# Patient Record
Sex: Female | Born: 1980 | Race: White | Hispanic: No | Smoking: Never smoker
Health system: Southern US, Community
[De-identification: ages and names within clinical notes are randomized; demographics above are authoritative.]

## PROBLEM LIST (undated history)

## (undated) DIAGNOSIS — E039 Hypothyroidism, unspecified: Secondary | ICD-10-CM

## (undated) DIAGNOSIS — Z803 Family history of malignant neoplasm of breast: Secondary | ICD-10-CM

## (undated) DIAGNOSIS — F32A Depression, unspecified: Secondary | ICD-10-CM

## (undated) DIAGNOSIS — Z1371 Encounter for nonprocreative screening for genetic disease carrier status: Secondary | ICD-10-CM

## (undated) DIAGNOSIS — N63 Unspecified lump in unspecified breast: Secondary | ICD-10-CM

## (undated) DIAGNOSIS — R87610 Atypical squamous cells of undetermined significance on cytologic smear of cervix (ASC-US): Secondary | ICD-10-CM

## (undated) DIAGNOSIS — F320 Major depressive disorder, single episode, mild: Secondary | ICD-10-CM

## (undated) HISTORY — DX: Major depressive disorder, single episode, mild: F32.0

## (undated) HISTORY — DX: Encounter for nonprocreative screening for genetic disease carrier status: Z13.71

## (undated) HISTORY — DX: Family history of malignant neoplasm of breast: Z80.3

## (undated) HISTORY — DX: Atypical squamous cells of undetermined significance on cytologic smear of cervix (ASC-US): R87.610

## (undated) HISTORY — DX: Depression, unspecified: F32.A

## (undated) HISTORY — DX: Hypothyroidism, unspecified: E03.9

## (undated) HISTORY — DX: Unspecified lump in unspecified breast: N63.0

---

## 2001-03-27 DIAGNOSIS — R87610 Atypical squamous cells of undetermined significance on cytologic smear of cervix (ASC-US): Secondary | ICD-10-CM

## 2001-03-27 HISTORY — PX: COLPOSCOPY: SHX161

## 2001-03-27 HISTORY — DX: Atypical squamous cells of undetermined significance on cytologic smear of cervix (ASC-US): R87.610

## 2006-11-07 ENCOUNTER — Emergency Department: Payer: Self-pay | Admitting: Emergency Medicine

## 2011-06-24 ENCOUNTER — Emergency Department: Payer: Self-pay | Admitting: Emergency Medicine

## 2012-03-27 HISTORY — PX: BREAST BIOPSY: SHX20

## 2012-08-27 DIAGNOSIS — N63 Unspecified lump in unspecified breast: Secondary | ICD-10-CM

## 2012-08-27 HISTORY — DX: Unspecified lump in unspecified breast: N63.0

## 2012-09-05 ENCOUNTER — Ambulatory Visit: Payer: Self-pay

## 2013-05-23 ENCOUNTER — Emergency Department: Payer: Self-pay | Admitting: Emergency Medicine

## 2013-05-23 LAB — CBC
HCT: 39.7 % (ref 35.0–47.0)
HGB: 13.8 g/dL (ref 12.0–16.0)
MCH: 30 pg (ref 26.0–34.0)
MCHC: 34.6 g/dL (ref 32.0–36.0)
MCV: 87 fL (ref 80–100)
PLATELETS: 303 10*3/uL (ref 150–440)
RBC: 4.59 10*6/uL (ref 3.80–5.20)
RDW: 13.5 % (ref 11.5–14.5)
WBC: 9.9 10*3/uL (ref 3.6–11.0)

## 2013-05-23 LAB — BASIC METABOLIC PANEL
ANION GAP: 3 — AB (ref 7–16)
BUN: 8 mg/dL (ref 7–18)
CHLORIDE: 104 mmol/L (ref 98–107)
CREATININE: 0.66 mg/dL (ref 0.60–1.30)
Calcium, Total: 8.7 mg/dL (ref 8.5–10.1)
Co2: 28 mmol/L (ref 21–32)
EGFR (African American): >60
EGFR (Non-African Amer.): >60
Glucose: 80 mg/dL (ref 65–99)
Osmolality: 267 (ref 275–301)
Potassium: 4.1 mmol/L (ref 3.5–5.1)
Sodium: 135 mmol/L — ABNORMAL LOW (ref 136–145)

## 2013-05-23 LAB — TROPONIN I: Troponin-I: <0.02 ng/mL

## 2015-10-21 ENCOUNTER — Other Ambulatory Visit: Payer: Self-pay | Admitting: Internal Medicine

## 2015-10-21 DIAGNOSIS — Z1231 Encounter for screening mammogram for malignant neoplasm of breast: Secondary | ICD-10-CM

## 2015-11-03 ENCOUNTER — Ambulatory Visit
Admission: RE | Admit: 2015-11-03 | Discharge: 2015-11-03 | Disposition: A | Discharge status: Home / Self Care | Payer: 59 | Source: Ambulatory Visit | Attending: Internal Medicine | Admitting: Internal Medicine

## 2015-11-03 ENCOUNTER — Other Ambulatory Visit: Payer: Self-pay | Admitting: Internal Medicine

## 2015-11-03 DIAGNOSIS — Z1231 Encounter for screening mammogram for malignant neoplasm of breast: Secondary | ICD-10-CM | POA: Insufficient documentation

## 2016-06-20 ENCOUNTER — Encounter: Payer: Self-pay | Admitting: Obstetrics and Gynecology

## 2016-06-20 ENCOUNTER — Ambulatory Visit: Payer: Self-pay | Admitting: Obstetrics and Gynecology

## 2016-06-20 NOTE — Telephone Encounter (Signed)
This encounter was created in error - please disregard.

## 2016-07-25 ENCOUNTER — Encounter: Payer: Self-pay | Admitting: Obstetrics and Gynecology

## 2016-07-25 ENCOUNTER — Ambulatory Visit (INDEPENDENT_AMBULATORY_CARE_PROVIDER_SITE_OTHER): Payer: 59 | Admitting: Obstetrics and Gynecology

## 2016-07-25 VITALS — BP 122/84 | HR 87 | Ht 61.0 in | Wt 131.0 lb

## 2016-07-25 DIAGNOSIS — R6882 Decreased libido: Secondary | ICD-10-CM

## 2016-07-25 DIAGNOSIS — Z30432 Encounter for removal of intrauterine contraceptive device: Secondary | ICD-10-CM

## 2016-07-25 DIAGNOSIS — N941 Unspecified dyspareunia: Secondary | ICD-10-CM

## 2016-07-25 NOTE — Progress Notes (Signed)
   Chief Complaint  Patient presents with  . IUD removal    painful intersourse     History of Present Illness:  Jessica Braun is a 36 y.o. that had a Mirena IUD placed approximately 4 years ago. She states she has had decreased libido, mood changes daily, and vaginal pain during sex Sx for the past 6 months. She still has monthly periods with IUD, lasting 7 days, med flow and with cramping. She would like the IUD removed and to go without [facility] for awhile to see about sx.   BP 122/84 (BP Location: Left Arm, Patient Position: Sitting, Cuff Size: Normal)   Pulse 87   Ht  (1.549 m)   Wt 131 lb (59.4 kg)   LMP 07/18/2016   BMI 24.75 kg/m   Pelvic exam:  Two IUD strings present seen coming from the cervical os. EGBUS, vaginal vault and cervix: within normal limits  IUD Removal Strings of IUD identified and grasped.  IUD removed without problem with ring forceps.  Pt tolerated this well.  IUD noted to be intact.  Assessment:  IUD Removal Encounter for removal of intrauterine contraceptive device (IUD) - IUD removed. Pt declines BC for now.  Dyspareunia in female - See if related to IUD. If sx persist after removal, f/u for further eval.  Decreased libido   Plan: IUD removed and plan for contraception is no method. She was amenable to this plan.  Camelia Stelzner B. Shadara Lopez, PA-C 07/25/2016 11:38 AM

## 2017-03-27 DIAGNOSIS — Z1371 Encounter for nonprocreative screening for genetic disease carrier status: Secondary | ICD-10-CM

## 2017-03-27 HISTORY — DX: Encounter for nonprocreative screening for genetic disease carrier status: Z13.71

## 2017-04-11 ENCOUNTER — Ambulatory Visit (INDEPENDENT_AMBULATORY_CARE_PROVIDER_SITE_OTHER): Payer: Managed Care, Other (non HMO) | Admitting: Obstetrics and Gynecology

## 2017-04-11 ENCOUNTER — Encounter: Payer: Self-pay | Admitting: Obstetrics and Gynecology

## 2017-04-11 VITALS — BP 118/76 | HR 68 | Ht 61.0 in | Wt 133.0 lb

## 2017-04-11 DIAGNOSIS — N898 Other specified noninflammatory disorders of vagina: Secondary | ICD-10-CM | POA: Diagnosis not present

## 2017-04-11 DIAGNOSIS — Z124 Encounter for screening for malignant neoplasm of cervix: Secondary | ICD-10-CM

## 2017-04-11 DIAGNOSIS — F320 Major depressive disorder, single episode, mild: Secondary | ICD-10-CM | POA: Insufficient documentation

## 2017-04-11 DIAGNOSIS — E039 Hypothyroidism, unspecified: Secondary | ICD-10-CM | POA: Insufficient documentation

## 2017-04-11 DIAGNOSIS — N63 Unspecified lump in unspecified breast: Secondary | ICD-10-CM

## 2017-04-11 DIAGNOSIS — F32A Depression, unspecified: Secondary | ICD-10-CM | POA: Insufficient documentation

## 2017-04-11 DIAGNOSIS — Z803 Family history of malignant neoplasm of breast: Secondary | ICD-10-CM

## 2017-04-11 DIAGNOSIS — Z1321 Encounter for screening for nutritional disorder: Secondary | ICD-10-CM | POA: Diagnosis not present

## 2017-04-11 DIAGNOSIS — Z01419 Encounter for gynecological examination (general) (routine) without abnormal findings: Secondary | ICD-10-CM | POA: Diagnosis not present

## 2017-04-11 MED ORDER — FLUCONAZOLE 150 MG PO TABS: 150.0000 mg | ORAL_TABLET | Freq: Once | ORAL | 0 refills | Status: DC

## 2017-04-11 NOTE — Progress Notes (Signed)
PCP:  Idelle Crouch, MD   Chief Complaint  Patient presents with  . Gynecologic Exam  . Breast Mass    left side     HPI:      Ms. Jessica Braun is a 37 y.o. G1P1001 who LMP was Patient's last menstrual period was 04/05/2017 (exact date)., presents today for her annual examination.  Her menses are regular every 28-30 days, lasting 5-7 days.  Dysmenorrhea mild, occurring first 1-2 days of flow. She does not have intermenstrual bleeding.  Sex activity: single partner, contraception - vasectomy.  Last Pap: February 08, 2016  Results were: no abnormalities /neg HPV DNA  Hx of STDs: none  There is a FH of breast cancer in 2 mat grt aunts and now a mat aunt, genetic testing not done. There is no FH of ovarian cancer. The patient does do self-breast exams. Has an oil cyst LT breast on 2014 u/s. Pt thinks it's a little bigger now (noted to be 6 mm on u/s) and is tender with menses. Would like f/u mammo.  Tobacco use: The patient denies current or previous tobacco use. Alcohol use: social drinker No drug use.  Exercise: moderately active  She does not get adequate calcium and Vitamin D in her diet.   Past Medical History:  Diagnosis Date  . ASCUS of cervix with negative high risk HPV 2003  . Breast mass 08/27/2012   Birads 2 Mammo- oil cyst  . Hypothyroidism   . Mild depression (Benjamin Perez)     Past Surgical History:  Procedure Laterality Date  . COLPOSCOPY  2003    Family History  Problem Relation Age of Onset  . Other Mother        benign breast cyst  . Breast cancer Maternal Aunt 65  . Colon cancer Maternal Grandmother        29s or 67s  . Brain cancer Father        agent orage exposure  . Breast cancer Other   . Colon cancer Other   . Breast cancer Other     Social History   Socioeconomic History  . Marital status: Legally Separated    Spouse name: Not on file  . Number of children: Not on file  . Years of education: Not on file  . Highest education  level: Not on file  Social Needs  . Financial resource strain: Not on file  . Food insecurity - worry: Not on file  . Food insecurity - inability: Not on file  . Transportation needs - medical: Not on file  . Transportation needs - non-medical: Not on file  Occupational History  . Not on file  Tobacco Use  . Smoking status: Never Smoker  . Smokeless tobacco: Never Used  Substance and Sexual Activity  . Alcohol use: Yes    Comment: Occ  . Drug use: No  . Sexual activity: Yes    Birth control/protection: IUD  Other Topics Concern  . Not on file  Social History Narrative  . Not on file    No outpatient medications have been marked as taking for the 04/11/17 encounter (Office Visit) with Ameshia Pewitt, Deirdre Evener, PA-C.     ROS:  Review of Systems  Constitutional: Negative for fatigue, fever and unexpected weight change.  Respiratory: Negative for cough, shortness of breath and wheezing.   Cardiovascular: Negative for chest pain, palpitations and leg swelling.  Gastrointestinal: Negative for blood in stool, constipation, diarrhea, nausea and vomiting.  Endocrine: Negative  for cold intolerance, heat intolerance and polyuria.  Genitourinary: Negative for dyspareunia, dysuria, flank pain, frequency, genital sores, hematuria, menstrual problem, pelvic pain, urgency, vaginal bleeding, vaginal discharge and vaginal pain.  Musculoskeletal: Negative for back pain, joint swelling and myalgias.  Skin: Negative for rash.  Neurological: Negative for dizziness, syncope, light-headedness, numbness and headaches.  Hematological: Negative for adenopathy.  Psychiatric/Behavioral: Negative for agitation, confusion, sleep disturbance and suicidal ideas. The patient is not nervous/anxious.      Objective: BP 118/76 (BP Location: Left Arm, Patient Position: Sitting, Cuff Size: Normal)   Pulse 68   Ht _0  (1.549 m)   Wt 133 lb (60.3 kg)   LMP 04/05/2017 (Exact Date)   BMI 25.13 kg/m    Physical  Exam  Constitutional: She is oriented to person, place, and time. She appears well-developed and well-nourished.  Genitourinary: Vagina normal and uterus normal. There is no rash or tenderness on the right labia. There is no rash or tenderness on the left labia. No erythema or tenderness in the vagina. No vaginal discharge found. Right adnexum does not display mass and does not display tenderness. Left adnexum does not display mass and does not display tenderness. Cervix does not exhibit motion tenderness or polyp. Uterus is not enlarged or tender.  Neck: Normal range of motion. No thyromegaly present.  Cardiovascular: Normal rate, regular rhythm and normal heart sounds.  No murmur heard. Pulmonary/Chest: Effort normal and breath sounds normal. Right breast exhibits no mass, no nipple discharge, no skin change and no tenderness. Left breast exhibits no mass, no nipple discharge, no skin change and no tenderness.  ~8 MM FIRM MOBILE MASS 2:00 LT BREAST NEAR AREOLA    Abdominal: Soft. There is no tenderness. There is no guarding.  Musculoskeletal: Normal range of motion.  Neurological: She is alert and oriented to person, place, and time. No cranial nerve deficit.  Psychiatric: She has a normal mood and affect. Her behavior is normal.  Vitals reviewed.   Assessment/Plan: Encounter for annual routine gynecological examination  Cervical cancer screening - Pt pref for work. - Plan: Pap IG (Image Guided)  Breast mass - LT br 2:00 periareolar. S/P u/s in 2014 that showed oil cyst. Pt notes slight increase in size. Will f/u with genetic testing results first then order imaging  Encounter for vitamin deficiency screening - Plan: VITAMIN D 25 Hydroxy (Vit-D Deficiency, Fractures)  Family history of breast cancer - MyRisk testing discussed and done today. RTO in 6 wks for f/u. - Plan: Integrated [facility] ([facility])  Vaginal discharge - Rx RF diflucan. Pt likes to keep one on  hand prn occas sx. - Plan: fluconazole (DIFLUCAN) 150 MG tablet  Meds ordered this encounter  Medications  . fluconazole (DIFLUCAN) 150 MG tablet    Sig: Take 1 tablet (150 mg total) by mouth once for 1 dose.    Dispense:  1 tablet    Refill:  0             GYN counsel mammography screening, adequate intake of calcium and vitamin D, diet and exercise     F/U  Return in about 6 weeks (around 05/23/2017) for [facility] f/u.  Dominic Mahaney B. Carlia Bomkamp, PA-C 04/11/2017 10:59 AM

## 2017-04-11 NOTE — Patient Instructions (Signed)
I value your feedback and entrusting us with your care. If you get a [facility] patient survey, I would appreciate you taking the time to let us know about your experience today. Thank you! 

## 2017-04-12 LAB — VITAMIN D 25 HYDROXY (VIT D DEFICIENCY, FRACTURES): Vit D, 25-Hydroxy: 27 ng/mL — ABNORMAL LOW (ref 30.0–100.0)

## 2017-04-13 LAB — PAP IG (IMAGE GUIDED): PAP Smear Comment: 0

## 2017-05-03 ENCOUNTER — Encounter: Payer: Self-pay | Admitting: Obstetrics and Gynecology

## 2017-05-22 ENCOUNTER — Telehealth: Payer: Self-pay | Admitting: Obstetrics and Gynecology

## 2017-05-22 NOTE — Telephone Encounter (Signed)
LM for pt several times to discuss neg MyRisk results. Pt already spoke with Annia FriendlyNancy Adams, GC, re: results. Since breast cancer risk <20%, no increased screening recommendations for pt. Will start yearly mammos at age 37. Cont SBE, yearly CBE. F/u prn.

## 2017-05-23 ENCOUNTER — Ambulatory Visit: Payer: Managed Care, Other (non HMO) | Admitting: Obstetrics and Gynecology

## 2017-07-27 ENCOUNTER — Telehealth: Payer: Self-pay

## 2017-07-27 NOTE — Telephone Encounter (Signed)
Pt had labs 04/11/17. [facility] received a denial for the Vit D from her insurance. On a fax back, they received a code of Z01.49. That is an invalid dx code.  Cb [id number] using [facility]#[id number]

## 2017-08-14 ENCOUNTER — Encounter: Payer: Self-pay | Admitting: Obstetrics and Gynecology

## 2017-08-14 ENCOUNTER — Other Ambulatory Visit: Payer: Self-pay | Admitting: Obstetrics and Gynecology

## 2017-08-14 DIAGNOSIS — N898 Other specified noninflammatory disorders of vagina: Secondary | ICD-10-CM

## 2017-08-14 MED ORDER — FLUCONAZOLE 150 MG PO TABS: 150.0000 mg | ORAL_TABLET | Freq: Once | ORAL | 0 refills | Status: AC

## 2017-08-14 NOTE — Progress Notes (Signed)
Rx diflucan for yeast vag after abx °

## 2020-02-26 ENCOUNTER — Other Ambulatory Visit: Payer: Self-pay | Admitting: Internal Medicine

## 2020-02-26 DIAGNOSIS — Z1231 Encounter for screening mammogram for malignant neoplasm of breast: Secondary | ICD-10-CM

## 2020-04-02 ENCOUNTER — Ambulatory Visit
Admission: RE | Admit: 2020-04-02 | Discharge: 2020-04-02 | Disposition: A | Discharge status: Home / Self Care | Payer: Managed Care, Other (non HMO) | Source: Ambulatory Visit | Attending: Internal Medicine | Admitting: Internal Medicine

## 2020-04-02 ENCOUNTER — Other Ambulatory Visit: Payer: Self-pay

## 2020-04-02 DIAGNOSIS — Z1231 Encounter for screening mammogram for malignant neoplasm of breast: Secondary | ICD-10-CM | POA: Insufficient documentation

## 2020-04-05 ENCOUNTER — Other Ambulatory Visit: Payer: Self-pay | Admitting: Internal Medicine

## 2020-04-05 DIAGNOSIS — N6320 Unspecified lump in the left breast, unspecified quadrant: Secondary | ICD-10-CM

## 2020-04-16 ENCOUNTER — Other Ambulatory Visit: Payer: Self-pay | Admitting: Internal Medicine

## 2020-04-16 ENCOUNTER — Other Ambulatory Visit: Payer: Self-pay

## 2020-04-16 ENCOUNTER — Ambulatory Visit
Admission: RE | Admit: 2020-04-16 | Discharge: 2020-04-16 | Disposition: A | Discharge status: Home / Self Care | Payer: Managed Care, Other (non HMO) | Source: Ambulatory Visit | Attending: Internal Medicine | Admitting: Internal Medicine

## 2020-04-16 ENCOUNTER — Other Ambulatory Visit: Payer: Self-pay | Admitting: Obstetrics

## 2020-04-16 ENCOUNTER — Other Ambulatory Visit: Payer: Self-pay | Admitting: Obstetrics and Gynecology

## 2020-04-16 DIAGNOSIS — R928 Other abnormal and inconclusive findings on diagnostic imaging of breast: Secondary | ICD-10-CM

## 2020-04-16 DIAGNOSIS — N6320 Unspecified lump in the left breast, unspecified quadrant: Secondary | ICD-10-CM

## 2020-04-20 ENCOUNTER — Other Ambulatory Visit: Payer: Self-pay | Admitting: Internal Medicine

## 2020-04-20 DIAGNOSIS — R921 Mammographic calcification found on diagnostic imaging of breast: Secondary | ICD-10-CM

## 2020-04-20 DIAGNOSIS — R928 Other abnormal and inconclusive findings on diagnostic imaging of breast: Secondary | ICD-10-CM

## 2020-04-22 ENCOUNTER — Ambulatory Visit
Admission: RE | Admit: 2020-04-22 | Discharge: 2020-04-22 | Disposition: A | Discharge status: Home / Self Care | Payer: Managed Care, Other (non HMO) | Source: Ambulatory Visit | Attending: Internal Medicine | Admitting: Internal Medicine

## 2020-04-22 ENCOUNTER — Other Ambulatory Visit: Payer: Self-pay

## 2020-04-22 DIAGNOSIS — R928 Other abnormal and inconclusive findings on diagnostic imaging of breast: Secondary | ICD-10-CM

## 2020-04-22 DIAGNOSIS — R921 Mammographic calcification found on diagnostic imaging of breast: Secondary | ICD-10-CM

## 2020-04-22 HISTORY — PX: BREAST BIOPSY: SHX20

## 2020-04-23 LAB — SURGICAL PATHOLOGY

## 2021-11-04 IMAGING — MG MM BREAST BX W/ LOC DEV 1ST LESION IMAGE BX SPEC STEREO GUIDE*R*
8 of 9 series · 8 of 9 positions shown · non-contrast
Comparison: Previous exams.
COMPARISON: Previous exams.

Addendum:
CLINICAL DATA: 3 mm group of indeterminate calcifications in the
posterior aspect of the upper-outer right breast at recent
mammography.

EXAM:
RIGHT BREAST STEREOTACTIC CORE NEEDLE BIOPSY

[R (1 of 8)]
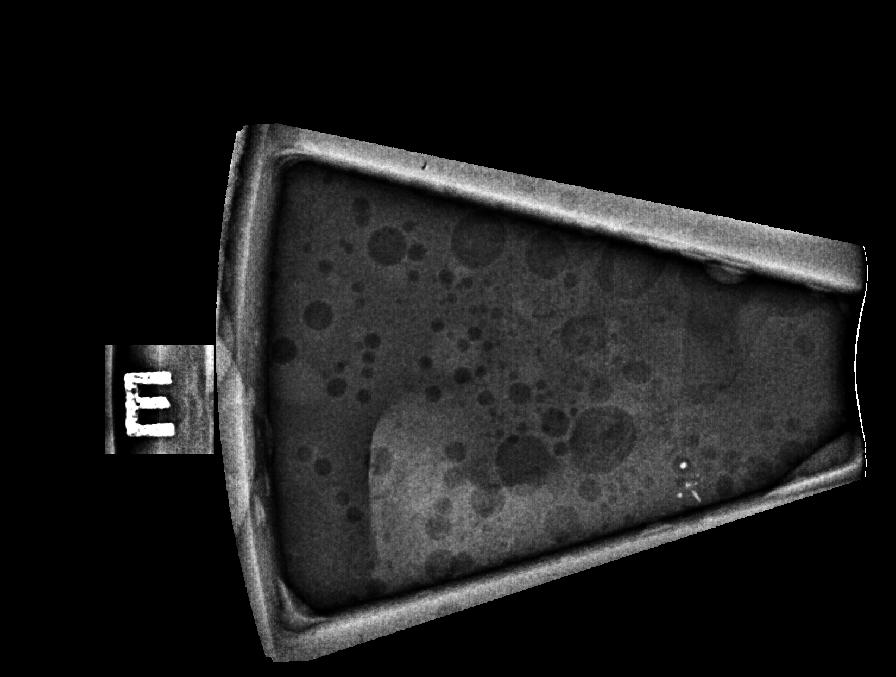

[R (2 of 8)]
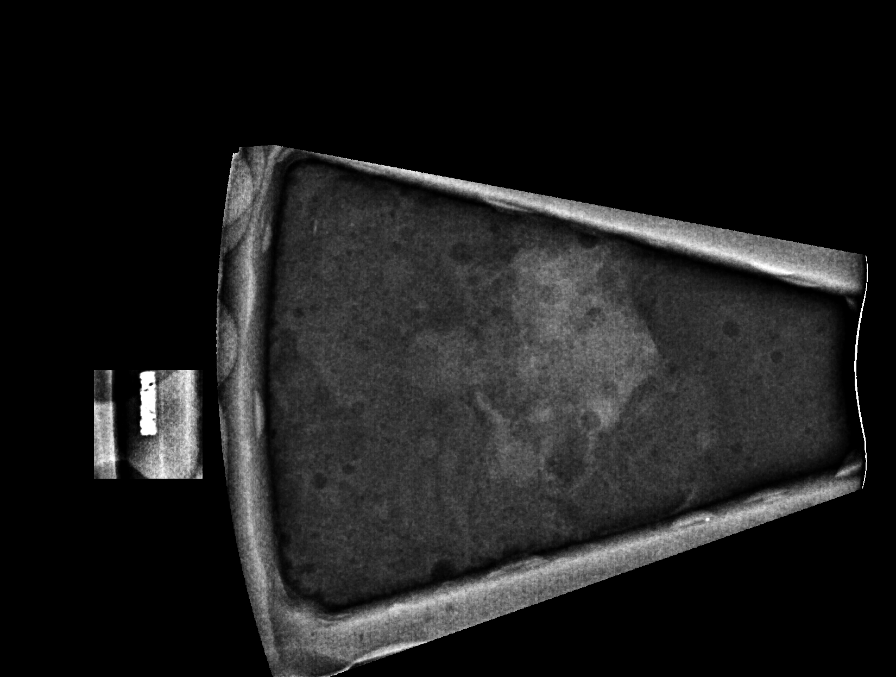

[R (3 of 8)]
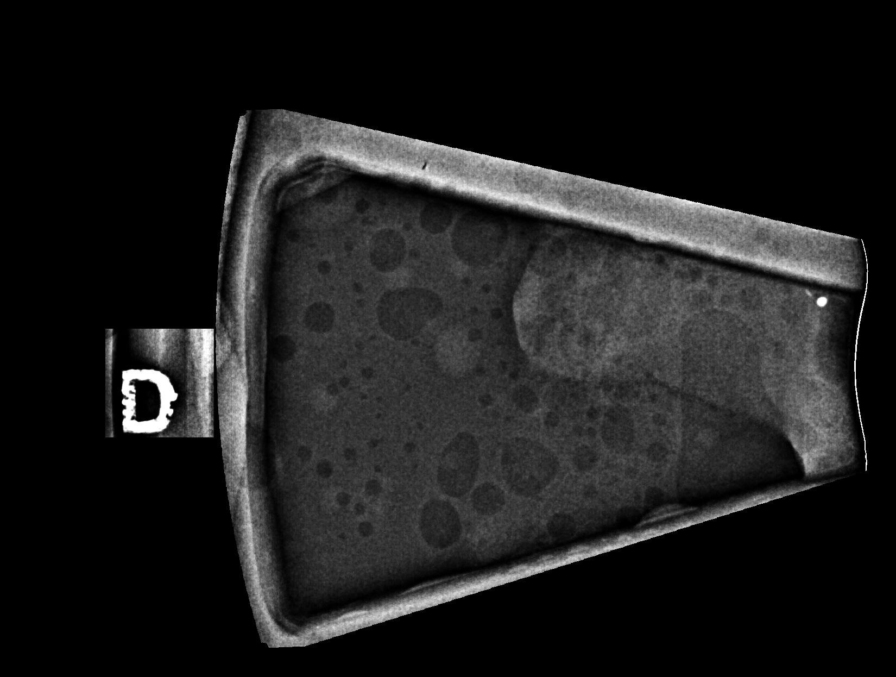

[R (4 of 8)]
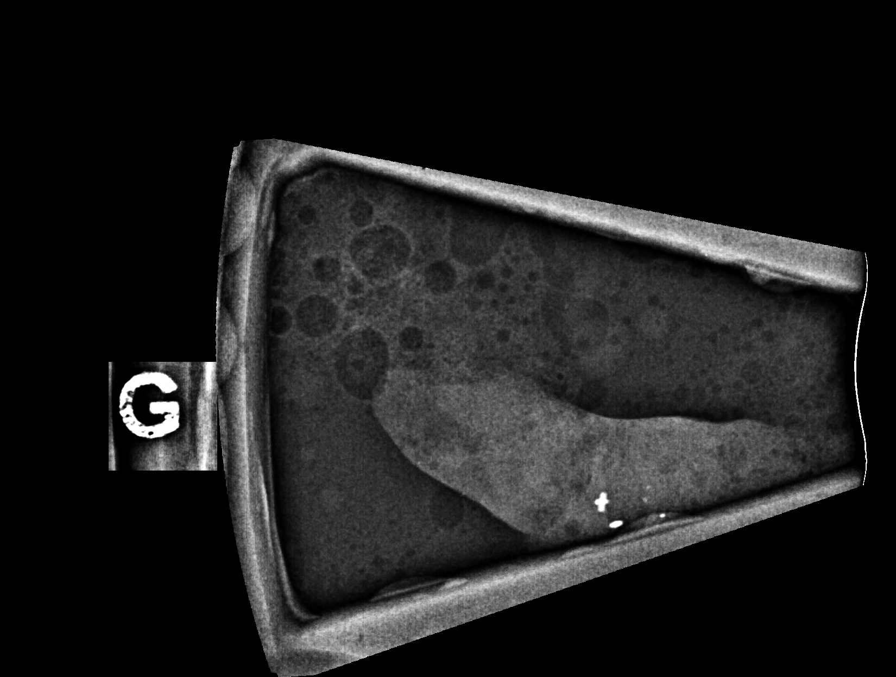

[R (5 of 8)]
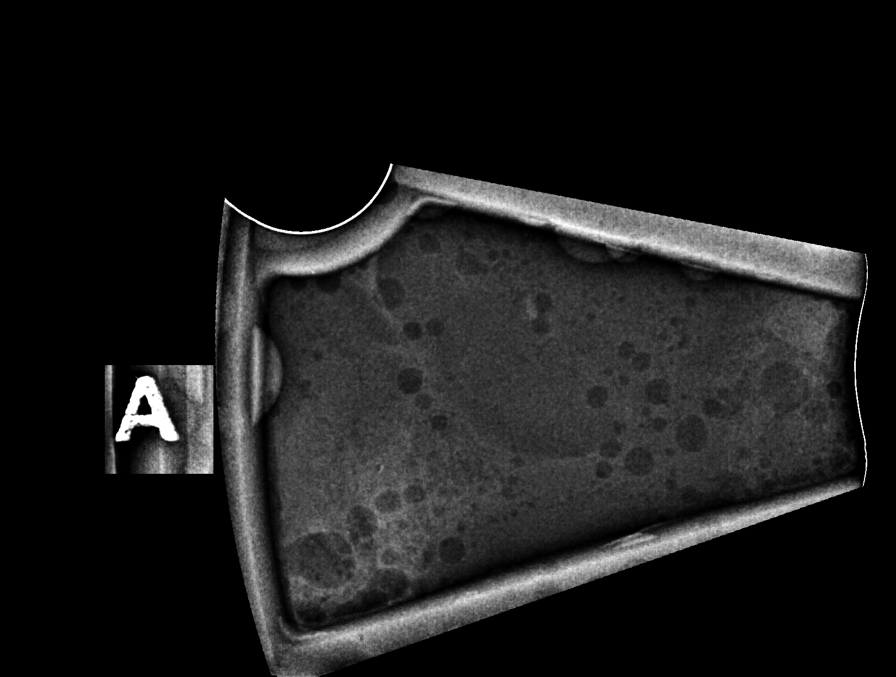

[R (6 of 8)]
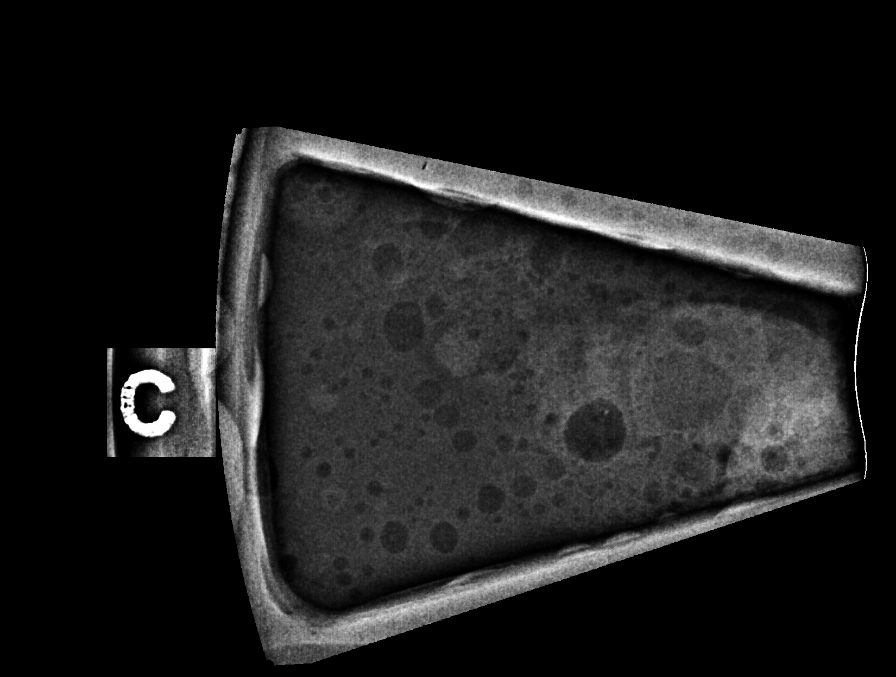

[R (7 of 8)]
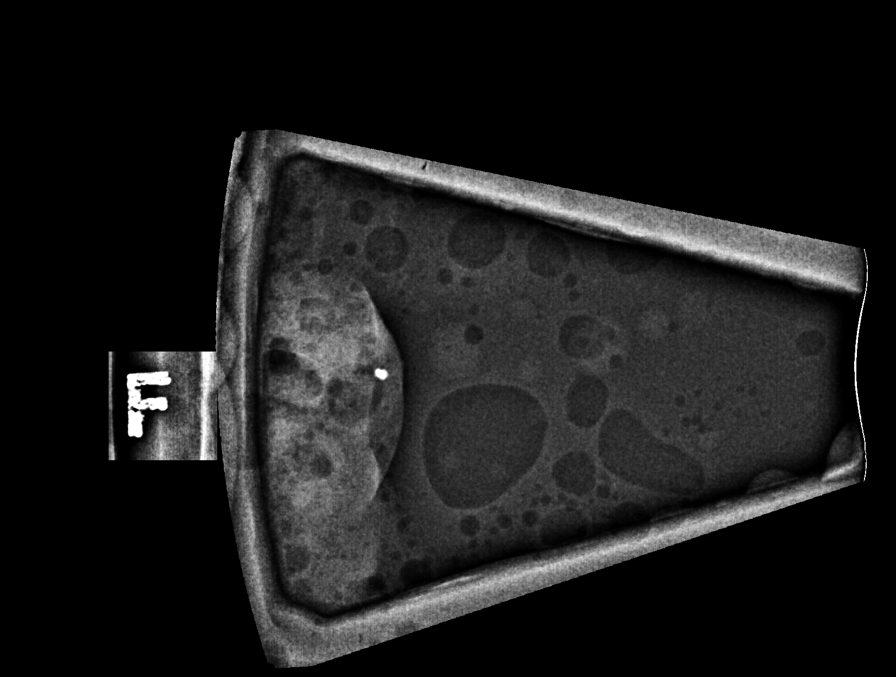

[R (8 of 8)]
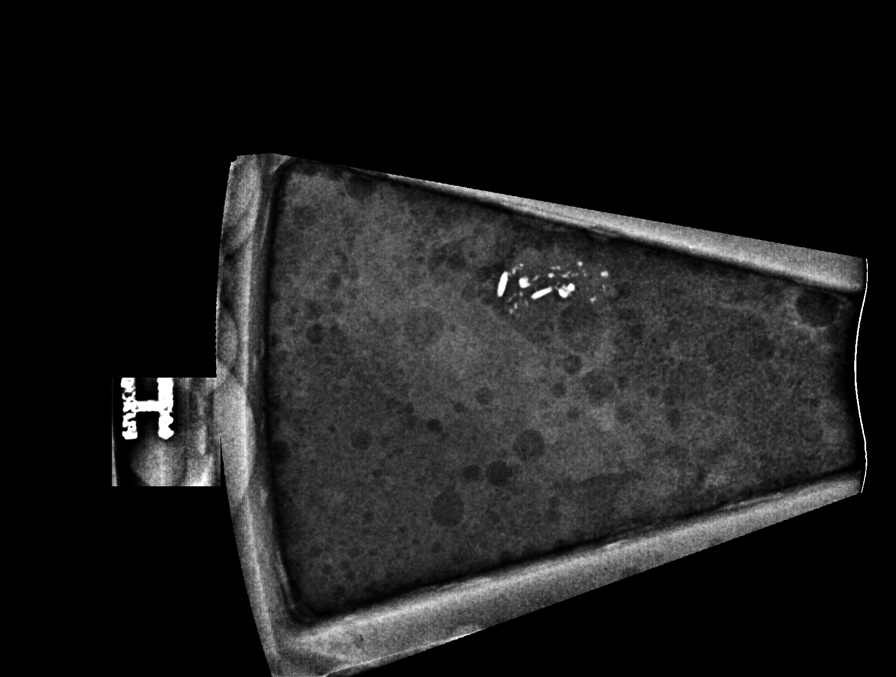

[8 of 9 positions shown; findings below may reference images not displayed]



Using sterile technique and 1% Lidocaine as local anesthetic, under
stereotactic guidance, a 9 gauge vacuum assisted device was used to
perform core needle biopsy of the recently demonstrated 3 mm group
of calcifications in the posterior aspect of the upper-outer right
breast using a cephalad approach. Specimen radiographs were
performed showing multiple calcifications in multiple specimens.
Specimens with calcifications are identified for pathology.

Lesion quadrant: Upper outer quadrant

At the conclusion of the procedure, a ribbon shaped tissue marker
clip was deployed into the biopsy cavity. Follow-up 2-view mammogram
was performed and dictated separately.
IMPRESSION: Stereotactic-guided biopsy of the recently demonstrated 3 mm group
of calcifications in the posterior aspect of the upper-outer right
breast. No apparent complications.

ADDENDUM:
PATHOLOGY revealed: A. RIGHT BREAST, UOQ CALCIFICATIONS;
STEREOTACTIC BIOPSY: - BENIGN BREAST TISSUE WITH FIBROCYSTIC CHANGES
AND ASSOCIATED CALCIFICATIONS. - NEGATIVE FOR ATYPIA AND MALIGNANCY.

Pathology results are CONCORDANT with imaging findings, per Dr.
Metka Malena Simerl.

Pathology results and recommendations below were discussed with
patient by telephone on 04/23/2020. Patient reported biopsy site
within normal limits with slight tenderness at the site. Post biopsy
care instructions were reviewed, questions were answered and my
direct phone number was provided to patient. Patient was instructed
to call [[email or web address]] if any concerns or questions arise
related to the biopsy.

Recommendation: Resume annual bilateral screening mammogram due
March 2020.

Pathology results reported by Kashava Pirlo Kozohura RN on 04/26/2020.



Using sterile technique and 1% Lidocaine as local anesthetic, under
stereotactic guidance, a 9 gauge vacuum assisted device was used to
perform core needle biopsy of the recently demonstrated 3 mm group
of calcifications in the posterior aspect of the upper-outer right
breast using a cephalad approach. Specimen radiographs were
performed showing multiple calcifications in multiple specimens.
Specimens with calcifications are identified for pathology.

Lesion quadrant: Upper outer quadrant

At the conclusion of the procedure, a ribbon shaped tissue marker
clip was deployed into the biopsy cavity. Follow-up 2-view mammogram
was performed and dictated separately.
IMPRESSION: Stereotactic-guided biopsy of the recently demonstrated 3 mm group
of calcifications in the posterior aspect of the upper-outer right
breast. No apparent complications.

## 2021-12-02 ENCOUNTER — Other Ambulatory Visit: Payer: Self-pay | Admitting: Internal Medicine

## 2021-12-02 DIAGNOSIS — R413 Other amnesia: Secondary | ICD-10-CM

## 2021-12-20 ENCOUNTER — Ambulatory Visit
Admission: RE | Admit: 2021-12-20 | Discharge: 2021-12-20 | Disposition: A | Discharge status: Home / Self Care | Payer: Managed Care, Other (non HMO) | Source: Ambulatory Visit | Attending: Internal Medicine | Admitting: Internal Medicine

## 2021-12-20 DIAGNOSIS — R413 Other amnesia: Secondary | ICD-10-CM

## 2022-02-24 ENCOUNTER — Other Ambulatory Visit: Payer: Self-pay | Admitting: Internal Medicine

## 2022-02-24 DIAGNOSIS — Z1231 Encounter for screening mammogram for malignant neoplasm of breast: Secondary | ICD-10-CM

## 2022-04-17 ENCOUNTER — Ambulatory Visit
Admission: RE | Admit: 2022-04-17 | Discharge: 2022-04-17 | Disposition: A | Discharge status: Home / Self Care | Payer: Managed Care, Other (non HMO) | Source: Ambulatory Visit | Attending: Internal Medicine | Admitting: Internal Medicine

## 2022-04-17 DIAGNOSIS — Z1231 Encounter for screening mammogram for malignant neoplasm of breast: Secondary | ICD-10-CM | POA: Diagnosis not present

## 2023-04-03 ENCOUNTER — Other Ambulatory Visit: Payer: Self-pay | Admitting: Internal Medicine

## 2023-04-03 DIAGNOSIS — Z1231 Encounter for screening mammogram for malignant neoplasm of breast: Secondary | ICD-10-CM

## 2023-04-20 ENCOUNTER — Inpatient Hospital Stay: Admission: RE | Admit: 2023-04-20 | Payer: Managed Care, Other (non HMO) | Source: Ambulatory Visit

## 2023-05-01 ENCOUNTER — Ambulatory Visit
Admission: RE | Admit: 2023-05-01 | Discharge: 2023-05-01 | Disposition: A | Discharge status: Home / Self Care | Payer: Managed Care, Other (non HMO) | Source: Ambulatory Visit | Attending: Internal Medicine | Admitting: Internal Medicine

## 2023-05-01 DIAGNOSIS — Z1231 Encounter for screening mammogram for malignant neoplasm of breast: Secondary | ICD-10-CM | POA: Insufficient documentation

## 2023-05-07 ENCOUNTER — Other Ambulatory Visit: Payer: Self-pay | Admitting: Internal Medicine

## 2023-05-07 DIAGNOSIS — R928 Other abnormal and inconclusive findings on diagnostic imaging of breast: Secondary | ICD-10-CM

## 2023-05-10 ENCOUNTER — Ambulatory Visit
Admission: RE | Admit: 2023-05-10 | Discharge: 2023-05-10 | Disposition: A | Discharge status: Home / Self Care | Payer: Managed Care, Other (non HMO) | Source: Ambulatory Visit | Attending: Internal Medicine | Admitting: Internal Medicine

## 2023-05-10 DIAGNOSIS — R928 Other abnormal and inconclusive findings on diagnostic imaging of breast: Secondary | ICD-10-CM | POA: Diagnosis present

## 2023-06-20 ENCOUNTER — Other Ambulatory Visit: Payer: Self-pay | Admitting: Internal Medicine

## 2023-06-20 DIAGNOSIS — I10 Essential (primary) hypertension: Secondary | ICD-10-CM

## 2023-06-20 DIAGNOSIS — R42 Dizziness and giddiness: Secondary | ICD-10-CM

## 2023-06-25 ENCOUNTER — Encounter: Payer: Self-pay | Admitting: Internal Medicine

## 2023-07-02 ENCOUNTER — Ambulatory Visit
Admission: RE | Admit: 2023-07-02 | Discharge: 2023-07-02 | Disposition: A | Discharge status: Home / Self Care | Source: Ambulatory Visit | Attending: Internal Medicine | Admitting: Internal Medicine

## 2023-07-02 DIAGNOSIS — R42 Dizziness and giddiness: Secondary | ICD-10-CM

## 2023-07-02 DIAGNOSIS — I10 Essential (primary) hypertension: Secondary | ICD-10-CM

## 2023-10-29 ENCOUNTER — Other Ambulatory Visit: Payer: Self-pay | Admitting: Internal Medicine

## 2023-10-29 DIAGNOSIS — R1013 Epigastric pain: Secondary | ICD-10-CM

## 2023-11-05 ENCOUNTER — Ambulatory Visit
Admission: RE | Admit: 2023-11-05 | Discharge: 2023-11-05 | Disposition: A | Discharge status: Home / Self Care | Source: Ambulatory Visit | Attending: Internal Medicine | Admitting: Internal Medicine

## 2023-11-05 DIAGNOSIS — R1013 Epigastric pain: Secondary | ICD-10-CM | POA: Insufficient documentation

## 2023-12-11 ENCOUNTER — Other Ambulatory Visit
Admission: RE | Admit: 2023-12-11 | Discharge: 2023-12-11 | Disposition: A | Discharge status: Home / Self Care | Source: Ambulatory Visit | Attending: Internal Medicine | Admitting: Internal Medicine

## 2023-12-11 DIAGNOSIS — R0781 Pleurodynia: Secondary | ICD-10-CM | POA: Diagnosis present

## 2023-12-11 LAB — D-DIMER, QUANTITATIVE: D-Dimer, Quant: 0.28 ug{FEU}/mL (ref 0.00–0.50)
